# Patient Record
Sex: Male | Born: 1975 | Race: White | Hispanic: No | Marital: Married | Smoking: Never smoker
Health system: Southern US, Community
[De-identification: ages and names within clinical notes are randomized; demographics above are authoritative.]

## PROBLEM LIST (undated history)

## (undated) DIAGNOSIS — R002 Palpitations: Secondary | ICD-10-CM

## (undated) DIAGNOSIS — R0600 Dyspnea, unspecified: Secondary | ICD-10-CM

## (undated) HISTORY — DX: Morbid (severe) obesity due to excess calories: E66.01

## (undated) HISTORY — DX: Palpitations: R00.2

## (undated) HISTORY — DX: Dyspnea, unspecified: R06.00

---

## 2012-01-13 ENCOUNTER — Ambulatory Visit (INDEPENDENT_AMBULATORY_CARE_PROVIDER_SITE_OTHER): Payer: BC Managed Care – PPO | Admitting: Physician Assistant

## 2012-01-13 VITALS — BP 157/75 | HR 83 | Temp 98.0°F | Resp 17 | Ht 73.5 in | Wt 269.0 lb

## 2012-01-13 DIAGNOSIS — R21 Rash and other nonspecific skin eruption: Secondary | ICD-10-CM

## 2012-01-13 LAB — POCT SKIN KOH: Skin KOH, POC: NEGATIVE

## 2012-01-13 MED ORDER — AMOXICILLIN 875 MG PO TABS: 875.0000 mg | ORAL_TABLET | Freq: Two times a day (BID) | ORAL | Status: DC

## 2012-01-13 MED ORDER — PREDNISONE 20 MG PO TABS: ORAL_TABLET | ORAL | Status: DC

## 2012-01-13 MED ORDER — DESONIDE 0.05 % EX CREA: TOPICAL_CREAM | Freq: Two times a day (BID) | CUTANEOUS | Status: DC

## 2012-01-13 NOTE — Addendum Note (Signed)
Addended by: Nelva Nay on: 01/13/2012 07:29 PM   Modules accepted: Orders

## 2012-01-13 NOTE — Progress Notes (Signed)
  Subjective:    Patient ID: Kenneth Lopez, male    DOB: Dec 15, 1975, 36 y.o.   MRN: [id number]  HPI 36 year old male presents with over 1 month history of rash on bilateral eyelids. States it is pruritic and irritated. He has tried several OTC remedies including antibacterial cream, anti-fungal cream, hydrocortisone cream, and aquaphor. He used each as solo therapy and did not combine any of these treatments.  States the anti-fungal cream has made his eyelids more irritated and inflamed. Hydrocortisone cream seemed to improve the redness but still left the skin cracked and burning.  No new lotions, products, foods, or medications.      Review of Systems  Constitutional: Negative for fever and chills.  Musculoskeletal: Negative for arthralgias.  Skin: Positive for color change and rash.  All other systems reviewed and are negative.       Objective:   Physical Exam  Vitals reviewed. Constitutional: He is oriented to person, place, and time. He appears well-developed and well-nourished.  HENT:  Head: Normocephalic and atraumatic.  Right Ear: External ear normal.  Left Ear: External ear normal.  Eyes: Conjunctivae normal and EOM are normal. Pupils are equal, round, and reactive to light.       Bilateral erythema on eyelids with scaling and inflammation  Neck: Normal range of motion.  Cardiovascular: Normal rate.   Pulmonary/Chest: Effort normal.  Neurological: He is alert and oriented to person, place, and time.  Psychiatric: He has a normal mood and affect. His behavior is normal. Judgment and thought content normal.    Results for orders placed in visit on 01/13/12  POCT SKIN KOH      Component Value Range   Skin KOH, POC Negative           Assessment & Plan:   1. Rash and other nonspecific skin eruption  POCT Skin KOH, desonide (DESOWEN) 0.05 % cream, predniSONE (DELTASONE) 20 MG tablet, amoxicillin (AMOXIL) 875 MG tablet   Likely irritated atopic dermatitis. D/C  all other products except moisturizers and aquaphor Desonide bid x 4 days, then daily x 4 days Prednisone dose pack Follow up here or with dermatology if symptoms persist.

## 2012-01-24 ENCOUNTER — Encounter: Payer: Self-pay | Admitting: Family Medicine

## 2012-01-24 ENCOUNTER — Ambulatory Visit (INDEPENDENT_AMBULATORY_CARE_PROVIDER_SITE_OTHER): Payer: BC Managed Care – PPO | Admitting: Family Medicine

## 2012-01-24 ENCOUNTER — Ambulatory Visit (HOSPITAL_COMMUNITY)
Admission: RE | Admit: 2012-01-24 | Discharge: 2012-01-24 | Disposition: A | Discharge status: Home / Self Care | Payer: BC Managed Care – PPO | Source: Ambulatory Visit | Attending: Family Medicine | Admitting: Family Medicine

## 2012-01-24 VITALS — BP 144/93 | HR 87 | Temp 98.5°F | Resp 16 | Ht 74.0 in | Wt 271.4 lb

## 2012-01-24 DIAGNOSIS — K089 Disorder of teeth and supporting structures, unspecified: Secondary | ICD-10-CM | POA: Insufficient documentation

## 2012-01-24 DIAGNOSIS — K0889 Other specified disorders of teeth and supporting structures: Secondary | ICD-10-CM

## 2012-01-24 DIAGNOSIS — J029 Acute pharyngitis, unspecified: Secondary | ICD-10-CM

## 2012-01-24 DIAGNOSIS — M8540 Solitary bone cyst, unspecified site: Secondary | ICD-10-CM

## 2012-01-24 LAB — POCT CBC
Granulocyte percent: 59.2 %G (ref 37–80)
HCT, POC: 52.8 % (ref 43.5–53.7)
Hemoglobin: 16.2 g/dL (ref 14.1–18.1)
Lymph, poc: 3.9 — AB (ref 0.6–3.4)
MCH, POC: 28.8 pg (ref 27–31.2)
MCHC: 30.7 g/dL — AB (ref 31.8–35.4)
MCV: 93.7 fL (ref 80–97)
MID (cbc): 0.7 (ref 0–0.9)
MPV: 8.7 fL (ref 0–99.8)
POC Granulocyte: 6.7 (ref 2–6.9)
POC LYMPH PERCENT: 34.5 %L (ref 10–50)
POC MID %: 6.3 %M (ref 0–12)
Platelet Count, POC: 282 10*3/uL (ref 142–424)
RBC: 5.63 M/uL (ref 4.69–6.13)
RDW, POC: 12.9 %
WBC: 11.3 10*3/uL — AB (ref 4.6–10.2)

## 2012-01-24 LAB — POCT SEDIMENTATION RATE: POCT SED RATE: 11 mm/hr (ref 0–22)

## 2012-01-24 MED ORDER — HYDROCODONE-ACETAMINOPHEN 5-500 MG PO TABS: 1.0000 | ORAL_TABLET | Freq: Three times a day (TID) | ORAL | Status: DC | PRN

## 2012-01-24 NOTE — Patient Instructions (Addendum)
[facility] for the [facility] xray of your jaw. We will call you with the report.     Driving directions to [facility]  [phone]  - more info    [address]  [place], [place] [address]     1. Head north on [address] toward [address]      344 ft    2. Turn right onto [address]      0.3 mi    3. Slight left to stay on [address]      1.7 mi    4. Turn left onto [address]  Destination will be on the right     0.6 mi     [facility]  [address] [place]

## 2012-01-24 NOTE — Progress Notes (Signed)
36 yo with pain left jaw and gums both upper and lower for 2 days, worsening.  He has been taking amoxicillin for 2 weeks because of a periorbital inflammation which has resolve.  Objective:  Mildly swollen gum at area of tooth #17. Mild left anterior cervical adenopathy TM's normal  Assessment:  Inflamed gum with good dentition otherwise and corresponding adenopathy.  Plan:  [facility] *RADIOLOGY REPORT*  Clinical Data: Left jaw pain and swelling. No known injury.  ORTHOPANTOGRAM/PANORAMIC  Comparison: None.  Findings: There is a well-circumscribed 2.5 x 1.5 cm lucent lesion  involving the left mandibular body adjacent to the second molar.  This demonstrates no definite matrix or expansion. The wisdom  teeth have been removed. There are no peri apical lucencies. No  other lucent lesions are identified.  IMPRESSION:  Well-circumscribed 2.5 cm lytic lesion involving the left mandible  as described. This has a nonaggressive appearance and could  reflect an odontogenic/dentigerous cyst, other cyst or fibrous  dysplasia. This could be further evaluated with maxillofacial CT.  Original Report Authenticated By: Carey Bullocks, M.D.  1. Pain, dental  DG Orthopantogram, HYDROcodone-acetaminophen (VICODIN) 5-500 MG per tablet, DG Orthopantogram  2. Sore throat  DG Orthopantogram, POCT SEDIMENTATION RATE, POCT CBC  3. Bone cyst, solitary  Ambulatory referral to ENT

## 2012-01-25 ENCOUNTER — Other Ambulatory Visit: Payer: Self-pay | Admitting: Radiology

## 2012-01-25 DIAGNOSIS — M270 Developmental disorders of jaws: Secondary | ICD-10-CM

## 2012-02-08 ENCOUNTER — Other Ambulatory Visit: Payer: Self-pay | Admitting: Oral Surgery

## 2012-02-08 DIAGNOSIS — K048 Radicular cyst: Secondary | ICD-10-CM

## 2012-02-10 ENCOUNTER — Ambulatory Visit
Admission: RE | Admit: 2012-02-10 | Discharge: 2012-02-10 | Disposition: A | Discharge status: Home / Self Care | Payer: BC Managed Care – PPO | Source: Ambulatory Visit | Attending: Oral Surgery | Admitting: Oral Surgery

## 2012-02-10 DIAGNOSIS — K048 Radicular cyst: Secondary | ICD-10-CM

## 2013-04-22 IMAGING — CT CT MAXILLOFACIAL W/O CM
2 of 5 series · 14 of 37 positions shown, 18 images · non-contrast
Comparison: [facility] 01/24/2012.

CLINICAL DATA: Mandibular cyst.  Left facial pain and swelling.

CT MAXILLOFACIAL WITHOUT CONTRAST
TECHNIQUE: Multidetector CT imaging of the maxillofacial
structures was performed. Multiplanar CT image reconstructions were
also generated.

[Series 3: max bone · axial · 0.33mm/px · z∈[-130,-32]mm · 13 of 47 slices shown, 17 images]
[im 4/47  brain]
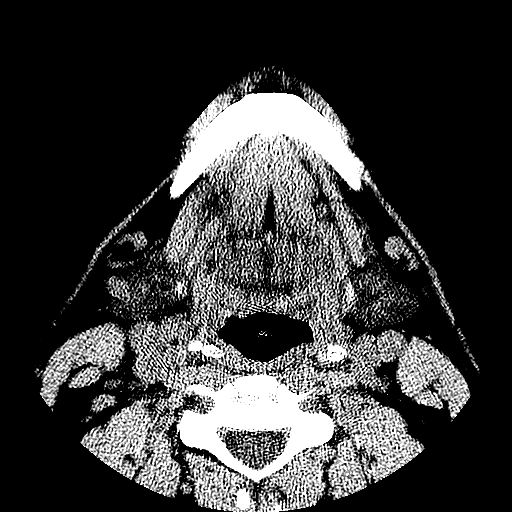
[im 4/47  bone]
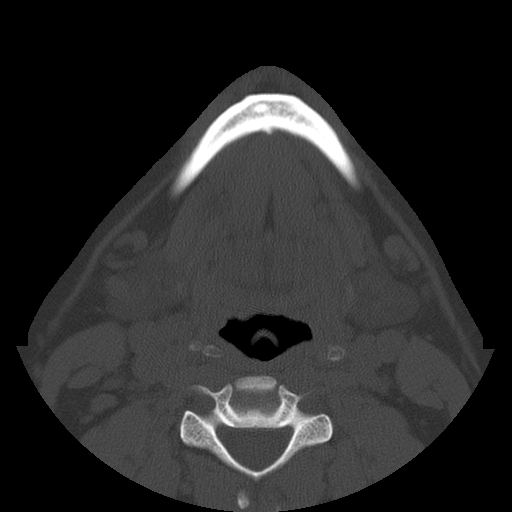
[im 7/47  bone]
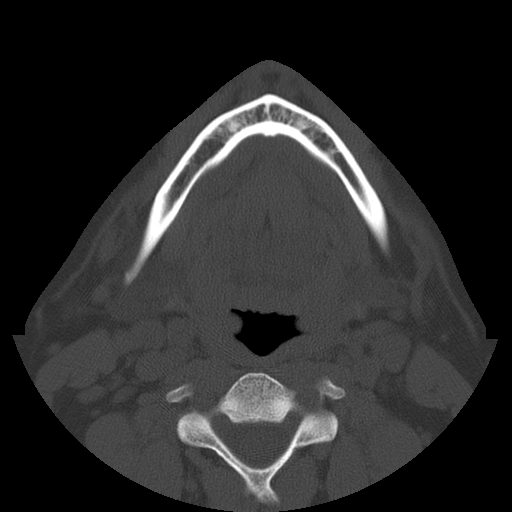
[im 10/47  bone]
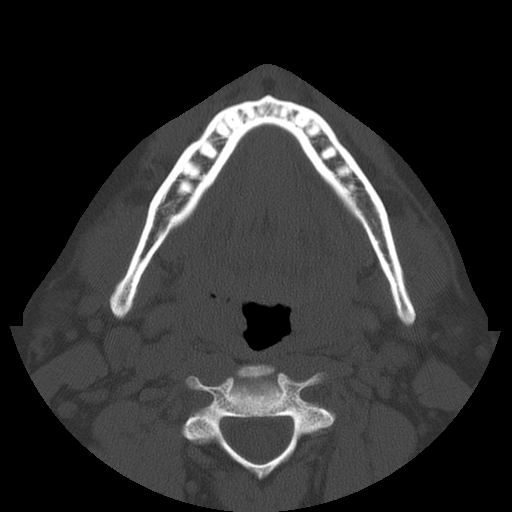
[im 14/47  bone]
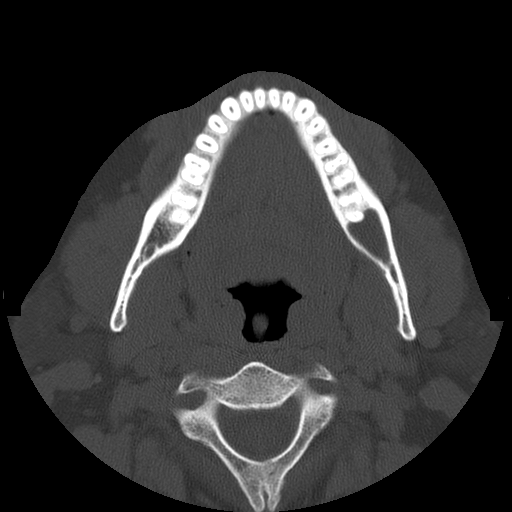
[im 17/47  brain]
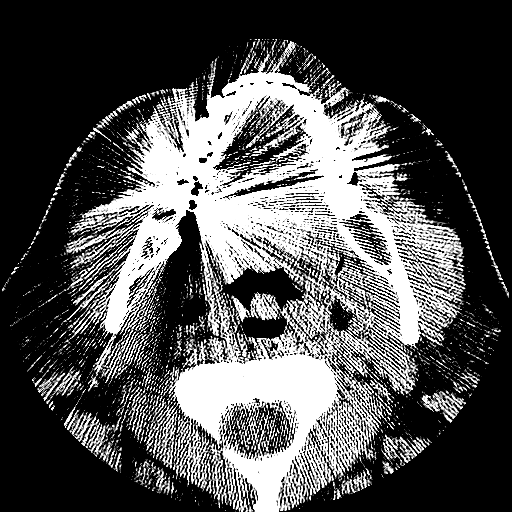
[im 17/47  bone]
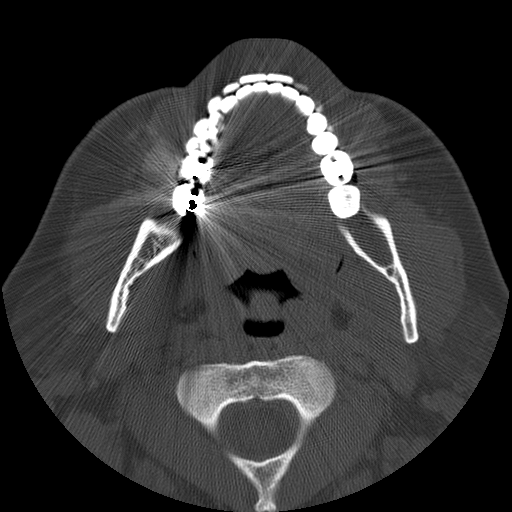
[im 20/47  bone]
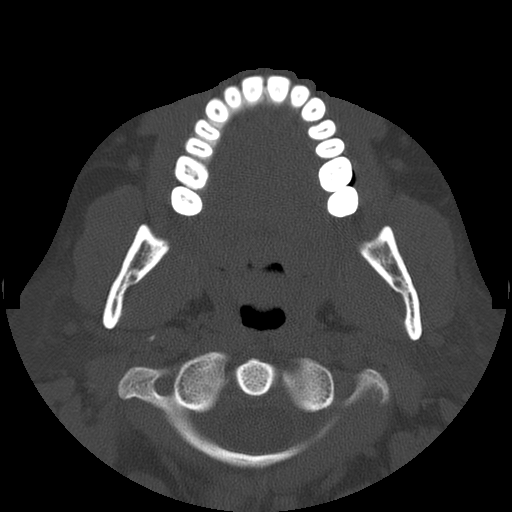
[im 24/47  bone]
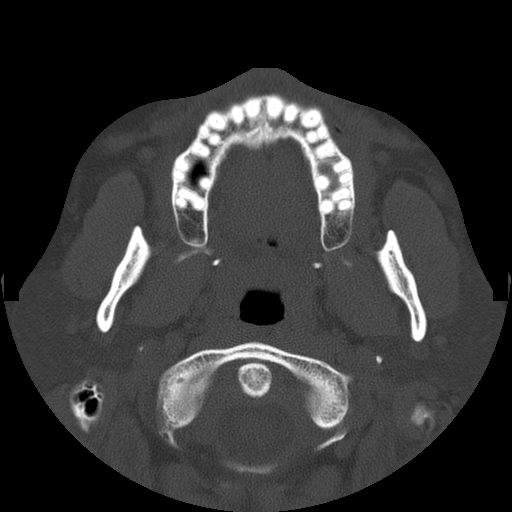
[im 27/47  bone]
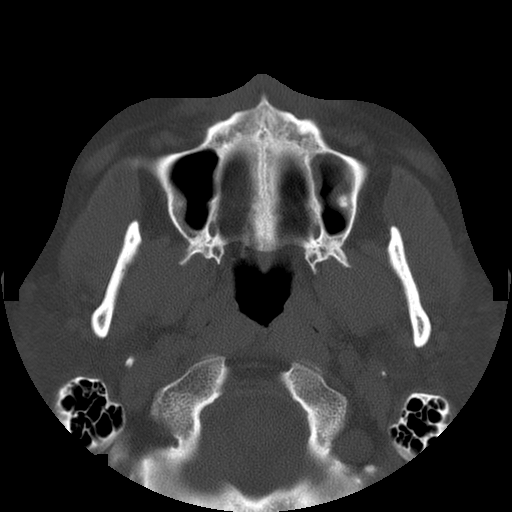
[im 30/47  brain]
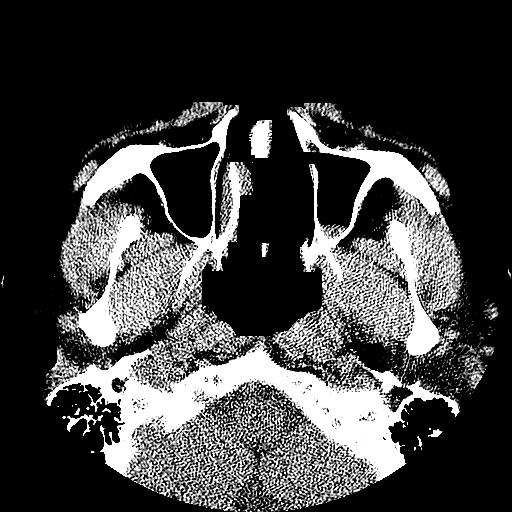
[im 30/47  bone]
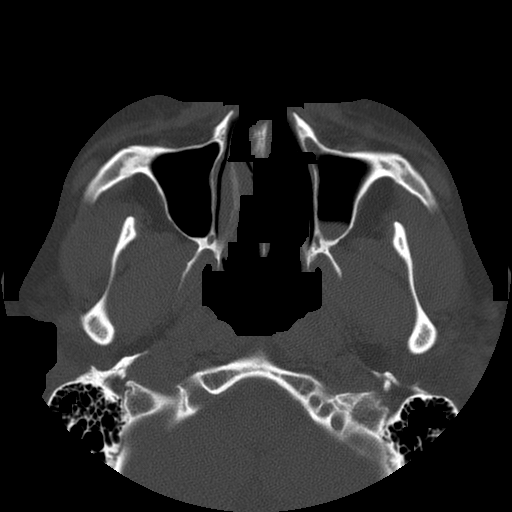
[im 33/47  bone]
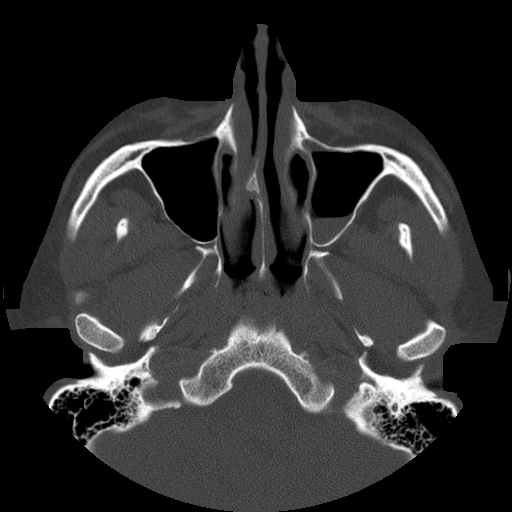
[im 37/47  bone]
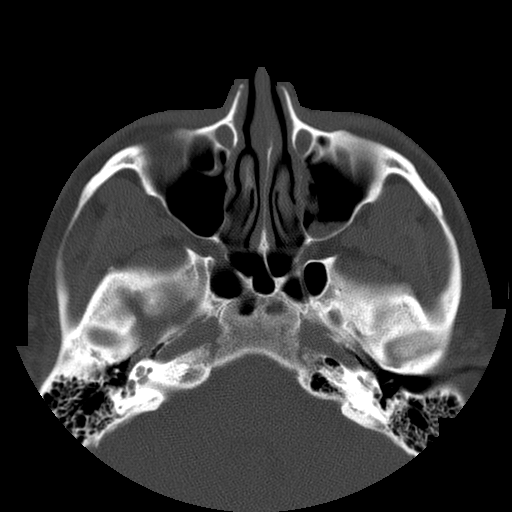
[im 40/47  bone]
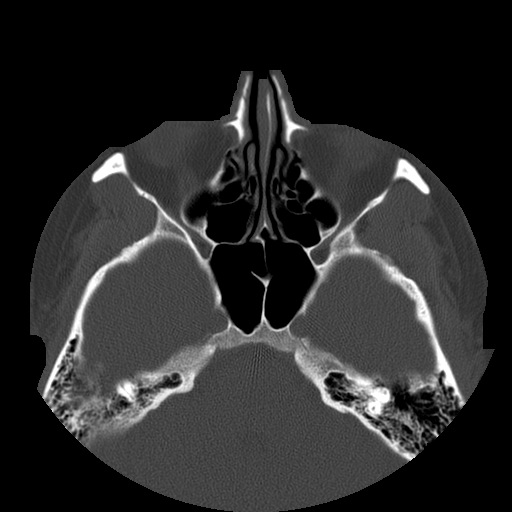
[im 43/47  brain]
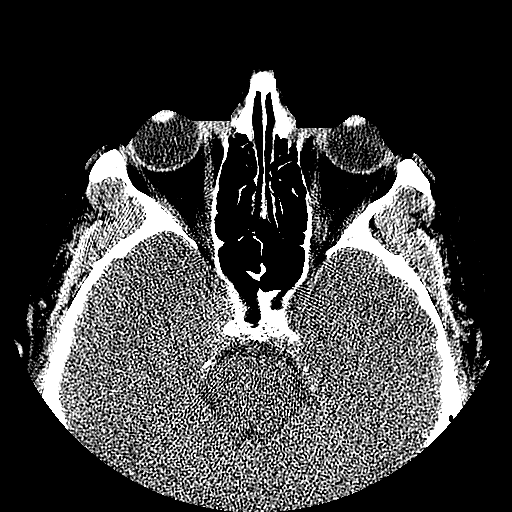
[im 43/47  bone]
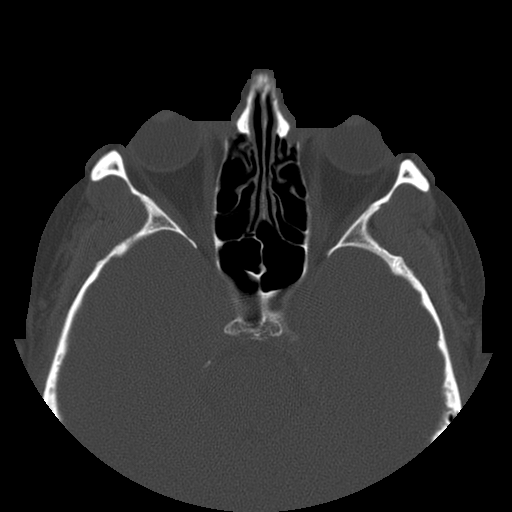

[Series 400: sag · sagittal · 0.33mm/px · 1 of 81 slices shown]
[im 41/81  bone]
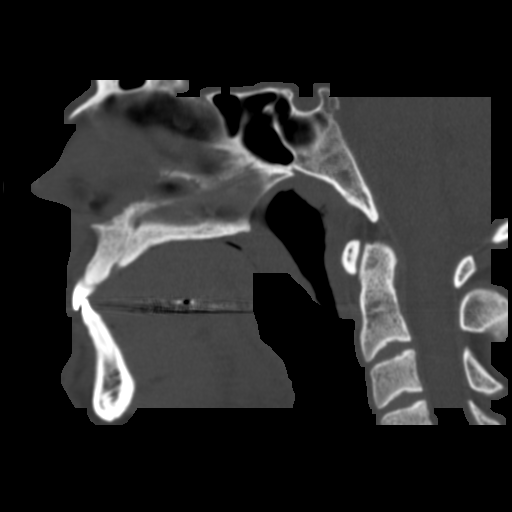

[14 of 37 positions shown; findings below may reference images not displayed]

FINDINGS: A well-defined smoothly marginated cystic lesion is
present within the posterior left mandible, just posterior to the
left second molar.  The posterior root of the left second molars
exposed to this cyst.  There is no significant expansion or
destruction of the bone.  No clear cortical covering is evident
over the superior aspect of the lesion.

The teeth are otherwise unremarkable.  No other significant.  Onto
lesions are present.  A fluid level is present in the left
maxillary sinus.  Minimal mucosal thickening is present in the left
maxillary sinus more anteriorly.  The visualized paranasal sinuses
and mastoid air cells are otherwise clear.
IMPRESSION: 1.  Cystic lesion posterior to the left second molar is most
compatible with an odontogenic  keratocyst.  A radicular cyst is
considered less likely.

## 2014-08-28 ENCOUNTER — Encounter (HOSPITAL_COMMUNITY)
Admission: AD | Disposition: A | Discharge status: Home / Self Care | Payer: Self-pay | Source: Ambulatory Visit | Attending: Cardiology

## 2014-08-28 ENCOUNTER — Ambulatory Visit (HOSPITAL_COMMUNITY)
Admission: AD | Admit: 2014-08-28 | Discharge: 2014-08-28 | Disposition: A | Discharge status: Home / Self Care | Payer: Managed Care, Other (non HMO) | Source: Ambulatory Visit | Attending: Cardiology | Admitting: Cardiology

## 2014-08-28 DIAGNOSIS — Z6836 Body mass index (BMI) 36.0-36.9, adult: Secondary | ICD-10-CM | POA: Diagnosis not present

## 2014-08-28 DIAGNOSIS — Z87891 Personal history of nicotine dependence: Secondary | ICD-10-CM | POA: Insufficient documentation

## 2014-08-28 DIAGNOSIS — E669 Obesity, unspecified: Secondary | ICD-10-CM | POA: Diagnosis not present

## 2014-08-28 DIAGNOSIS — Z8249 Family history of ischemic heart disease and other diseases of the circulatory system: Secondary | ICD-10-CM | POA: Insufficient documentation

## 2014-08-28 DIAGNOSIS — I209 Angina pectoris, unspecified: Secondary | ICD-10-CM | POA: Diagnosis present

## 2014-08-28 DIAGNOSIS — I200 Unstable angina: Secondary | ICD-10-CM

## 2014-08-28 DIAGNOSIS — I25119 Atherosclerotic heart disease of native coronary artery with unspecified angina pectoris: Secondary | ICD-10-CM | POA: Insufficient documentation

## 2014-08-28 DIAGNOSIS — E785 Hyperlipidemia, unspecified: Secondary | ICD-10-CM | POA: Insufficient documentation

## 2014-08-28 HISTORY — PX: CARDIAC CATHETERIZATION: SHX172

## 2014-08-28 SURGERY — LEFT HEART CATH AND CORONARY ANGIOGRAPHY
Anesthesia: LOCAL

## 2014-08-28 MED ORDER — NITROGLYCERIN 1 MG/10 ML FOR IR/CATH LAB
INTRA_ARTERIAL | Status: DC | PRN
  Administered 2014-08-28: 17:00:00

## 2014-08-28 MED ORDER — SODIUM CHLORIDE 0.9 % WEIGHT BASED INFUSION: 3.0000 mL/kg/h | INTRAVENOUS | Status: DC

## 2014-08-28 MED ORDER — MIDAZOLAM HCL 2 MG/2ML IJ SOLN
INTRAMUSCULAR | Status: DC | PRN
  Administered 2014-08-28: 1 mg via INTRAVENOUS
  Administered 2014-08-28: 2 mg via INTRAVENOUS

## 2014-08-28 MED ORDER — SODIUM CHLORIDE 0.9 % IJ SOLN: 3.0000 mL | INTRAMUSCULAR | Status: DC | PRN

## 2014-08-28 MED ORDER — SODIUM CHLORIDE 0.9 % IV SOLN: 250.0000 mL | INTRAVENOUS | Status: DC | PRN

## 2014-08-28 MED ORDER — ASPIRIN 81 MG PO CHEW
81.0000 mg | CHEWABLE_TABLET | ORAL | Status: AC
  Administered 2014-08-28: 81 mg via ORAL

## 2014-08-28 MED ORDER — FENTANYL CITRATE (PF) 100 MCG/2ML IJ SOLN: 25.0000 ug | Freq: Once | INTRAMUSCULAR | Status: DC

## 2014-08-28 MED ORDER — IOHEXOL 350 MG/ML SOLN
INTRAVENOUS | Status: DC | PRN
  Administered 2014-08-28: 95 mL via INTRAVENOUS

## 2014-08-28 MED ORDER — LIDOCAINE HCL (PF) 1 % IJ SOLN
INTRAMUSCULAR | Status: AC
  Filled 2014-08-28: qty 30

## 2014-08-28 MED ORDER — MIDAZOLAM HCL 2 MG/2ML IJ SOLN
INTRAMUSCULAR | Status: AC
  Filled 2014-08-28: qty 2

## 2014-08-28 MED ORDER — SODIUM CHLORIDE 0.9 % IJ SOLN: 3.0000 mL | Freq: Two times a day (BID) | INTRAMUSCULAR | Status: DC

## 2014-08-28 MED ORDER — SODIUM CHLORIDE 0.9 % WEIGHT BASED INFUSION
3.0000 mL/kg/h | INTRAVENOUS | Status: AC
  Administered 2014-08-28: 3 mL/kg/h via INTRAVENOUS

## 2014-08-28 MED ORDER — ASPIRIN 81 MG PO CHEW
CHEWABLE_TABLET | ORAL | Status: AC
  Filled 2014-08-28: qty 1

## 2014-08-28 MED ORDER — RADIAL COCKTAIL (HEPARIN/VERAPAMIL/LIDOCAINE/NITRO)
Status: DC | PRN
  Administered 2014-08-28: 1 via INTRA_ARTERIAL

## 2014-08-28 MED ORDER — HEPARIN SODIUM (PORCINE) 1000 UNIT/ML IJ SOLN
INTRAMUSCULAR | Status: DC | PRN
  Administered 2014-08-28: 7500 [IU] via INTRAVENOUS

## 2014-08-28 MED ORDER — SODIUM CHLORIDE 0.9 % WEIGHT BASED INFUSION: 1.0000 mL/kg/h | INTRAVENOUS | Status: DC

## 2014-08-28 MED ORDER — HYDROMORPHONE HCL 1 MG/ML IJ SOLN
INTRAMUSCULAR | Status: DC | PRN
  Administered 2014-08-28 (×2): 0.5 mg via INTRAVENOUS

## 2014-08-28 MED ORDER — SODIUM CHLORIDE 0.9 % IJ SOLN
3.0000 mL | INTRAMUSCULAR | Status: DC | PRN
Start: 2014-08-28 — End: 2014-08-29

## 2014-08-28 MED ORDER — HYDROMORPHONE HCL 1 MG/ML IJ SOLN
INTRAMUSCULAR | Status: AC
  Filled 2014-08-28: qty 1

## 2014-08-28 MED ORDER — HEPARIN (PORCINE) IN NACL 2-0.9 UNIT/ML-% IJ SOLN
INTRAMUSCULAR | Status: AC
  Filled 2014-08-28: qty 1000

## 2014-08-28 MED ORDER — NITROGLYCERIN 1 MG/10 ML FOR IR/CATH LAB
INTRA_ARTERIAL | Status: AC
  Filled 2014-08-28: qty 20

## 2014-08-28 MED ORDER — NITROGLYCERIN 1 MG/10 ML FOR IR/CATH LAB
INTRA_ARTERIAL | Status: DC | PRN
  Administered 2014-08-28 (×2): 200 ug

## 2014-08-28 SURGICAL SUPPLY — 11 items
CATH INFINITI 5FR MPB2 (CATHETERS) IMPLANT
CATH OPTITORQUE TIG 4.0 5F (CATHETERS) ×2 IMPLANT
DEVICE RAD COMP TR BAND LRG (VASCULAR PRODUCTS) ×2 IMPLANT
GLIDESHEATH SLEND A-KIT 6F 20G (SHEATH) ×2 IMPLANT
KIT HEART LEFT (KITS) ×2 IMPLANT
PACK CARDIAC CATHETERIZATION (CUSTOM PROCEDURE TRAY) ×2 IMPLANT
SHEATH PINNACLE 5F 10CM (SHEATH) IMPLANT
TRANSDUCER W/STOPCOCK (MISCELLANEOUS) ×2 IMPLANT
TUBING CIL FLEX 10 FLL-RA (TUBING) ×2 IMPLANT
WIRE EMERALD 3MM-J .035X150CM (WIRE) IMPLANT
WIRE SAFE-T 1.5MM-J .035X260CM (WIRE) ×2 IMPLANT

## 2014-08-28 NOTE — Discharge Instructions (Signed)
Radial Site Care °Refer to this sheet in the next few weeks. These instructions provide you with information on caring for yourself after your procedure. Your caregiver may also give you more specific instructions. Your treatment has been planned according to current medical practices, but problems sometimes occur. Call your caregiver if you have any problems or questions after your procedure. °HOME CARE INSTRUCTIONS °· You may shower the day after the procedure. Remove the bandage (dressing) and gently wash the site with plain soap and water. Gently pat the site dry. °· Do not apply powder or lotion to the site. °· Do not submerge the affected site in water for 3 to 5 days. °· Inspect the site at least twice daily. °· Do not flex or bend the affected arm for 24 hours. °· No lifting over 5 pounds (2.3 kg) for 5 days after your procedure. °· Do not drive home if you are discharged the same day of the procedure. Have someone else drive you. °· You may drive 24 hours after the procedure unless otherwise instructed by your caregiver. °· Do not operate machinery or power tools for 24 hours. °· A responsible adult should be with you for the first 24 hours after you arrive home. °What to expect: °· Any bruising will usually fade within 1 to 2 weeks. °· Blood that collects in the tissue (hematoma) may be painful to the touch. It should usually decrease in size and tenderness within 1 to 2 weeks. °SEEK IMMEDIATE MEDICAL CARE IF: °· You have unusual pain at the radial site. °· You have redness, warmth, swelling, or pain at the radial site. °· You have drainage (other than a small amount of blood on the dressing). °· You have chills. °· You have a fever or persistent symptoms for more than 72 hours. °· You have a fever and your symptoms suddenly get worse. °· Your arm becomes pale, cool, tingly, or numb. °· You have heavy bleeding from the site. Hold pressure on the site. °Document Released: 03/12/2010 Document Revised:  05/02/2011 Document Reviewed: 03/12/2010 °ExitCare® Patient Information ©2015 [employer]. This information is not intended to replace advice given to you by your health care provider. Make sure you discuss any questions you have with your health care provider. ° °

## 2014-08-28 NOTE — H&P (Signed)
OFFICE VISIT NOTES COPIED TO EPIC FOR DOCUMENTATION  Kenneth Lopez 08/27/2014 12:57 PM Location: [facility] Patient #: [id number] DOB: 1975/08/30 Married / Language: Kenneth Lopez / Race: White Male  History of Present Illness Kenneth Lopez Lopez; 08/27/2014 2:02 PM) The patient is a 39 year old male who presents with chest pain. Symptoms include chest pain. The pain is located in the left anterior chest. The pain radiates to the left arm and left shoulder. The patient describes the pain as burning and tight. Onset was sudden 2 week(s) ago. Onset followed exertion. The symptoms occur with activity. The patient describes this as moderate in severity and unchanged. Symptoms are exacerbated by exertion. Symptoms are relieved by resting. Associated symptoms include dyspnea, fatigue and heartburn, while associated symptoms do not include leg swelling, lightheadedness or syncope. Current treatment includes aspirin and proton pump inhibitors. By report there is good compliance with treatment, good tolerance of treatment and fair symptom control. Risk factors include atherogenic diet, cigarette smoking (quit in 2012), obesity, sedentary lifestyle and family history of premature coronary artery disease. The patient was previously evaluated by a primary physician 2 week(s) ago. Previous presentation included chest pain. Past evaluation has included stress ECG testing. Past treatment has included aspirin and proton pump inhibitors.  Problem List/Past [facility] Kenneth Lopez, [place]; 08/27/2014 1:43 PM) No Known Problems07/07/2014 (Marked as Inactive)  Allergies (Kenneth Lopez; 08/27/2014 1:01 PM) [employer]! [employer] *DERMATOLOGICALS* Dermatitis, Hives, Swelling.  Family History Kenneth Lopez; 08/27/2014 1:04 PM) Mother In stable health. DM; no heart attacks or strokes; HTN, but no other cardiovascular conditions Father In stable health. MI at age 69, no heart attacks since then, no  strokes; cardiac bypass at age 79, no stents, CAD but no other cardiovascular conditions Sister 1 In good health. 2 yrs older; no cardiovascular conditions Brother 1 In good health. 6 yrs younger; no cardiovascular conditions  Social History Kenneth Lopez; 08/27/2014 1:06 PM) Marital status Married. Living Situation Lives with spouse. Number of Children 3. Alcohol Use Occasional alcohol use. Current tobacco use Former smoker. quit in 2012  Past Surgical History (Kenneth Lopez; 08/27/2014 1:07 PM) Elbow Fracture [id number] Mandibular Cyst Removal2014  Medication History (Kenneth Lopez; 08/27/2014 1:09 PM) Omeprazole ($RemoveBeforeD'40MG'[id number]$  Capsule DR, 1 Oral daily) Active. Aspirin ($RemoveBeforeDE'81MG'fjaqpojggJBbbqw$  Tablet DR, 1 Oral daily) Active. Medications Reconciled  Diagnostic Studies History Kenneth Lopez; 08/27/2014 1:08 PM) Treadmill stress test07/07/2014   Review of Systems (Kenneth Lopez; 08/27/2014 2:00 PM) General Present- Fatigue. Not Present- Anorexia and Fever. Respiratory Present- Decreased Exercise Tolerance. Not Present- Cough and Dyspnea. Cardiovascular Present- Chest Pain. Not Present- Claudications, Edema, Orthopnea, Paroxysmal Nocturnal Dyspnea and Shortness of Breath. Gastrointestinal Not Present- Change in Bowel Habits, Constipation and Nausea. Neurological Not Present- Focal Neurological Symptoms. Endocrine Not Present- Appetite Changes, Cold Intolerance and Heat Intolerance. Hematology Not Present- Anemia, Petechiae and Prolonged Bleeding. Vitals Kenneth Lopez; 08/27/2014 1:12 PM) 08/27/2014 1:09 PM Weight: 291 lb Height: 75in Body Surface Area: 2.57 m Body Mass Index: 36.37 kg/m  Pulse: 116 (Regular)  P.OX: 97% (Room air) BP: 138/92 (Sitting, Left Arm, Standard)     Physical Exam (Kenneth Lopez; 08/27/2014 1:58 PM) General Mental Status-Alert. General Appearance-Cooperative, Appears stated age, Not in acute distress. Orientation-Oriented  X3. Build & Nutrition-Well built and Moderately obese.  Head and Neck Thyroid Gland Characteristics - no palpable nodules, no palpable enlargement.  Chest and Lung Exam Palpation Tender - No chest wall tenderness. Auscultation Breath sounds - Clear.  Cardiovascular Inspection Jugular vein - Right - No  Distention. Auscultation Heart Sounds - S1 WNL, S2 WNL and No gallop present. Murmurs & Other Heart Sounds - Murmur - No murmur.  Abdomen Palpation/Percussion Palpation and Percussion of the abdomen reveal - Non Tender and No hepatosplenomegaly. Auscultation Auscultation of the abdomen reveals - Bowel sounds normal.  Peripheral Vascular Lower Extremity Inspection - Left - No Pigmentation, No Varicose veins. Right - No Pigmentation, No Varicose veins. Palpation - Edema - Left - No edema. Right - No edema. Femoral pulse - Left - Normal. Right - Normal. Popliteal pulse - Left - Normal. Right - Normal. Dorsalis pedis pulse - Left - Normal. Right - Normal. Posterior tibial pulse - Left - Normal. Right - Normal. Carotid arteries - Left-No Carotid bruit. Carotid arteries - Right-No Carotid bruit. Abdomen-No prominent abdominal aortic pulsation, No epigastric bruit.  Neurologic Motor-Grossly intact without any focal deficits.  Musculoskeletal Global Assessment Left Lower Extremity - normal range of motion without pain. Right Lower Extremity - normal range of motion without pain.  Assessment & Plan (Kenneth Lopez; 08/27/2014 2:31 PM) New-onset angina (I20.9) Impression: EKG 08/27/2014: Sinus tachycardia at rate of 101 bpm, normal axis, T wave inversion in lead 3 and aVF, cannot exclude ischemia. Current Plans Complete electrocardiogram (93000) Started Crestor 20MG , 1 (one) Tablet daily, #30, 08/27/2014, Ref. x2. Started Metoprolol Tartrate 25MG , 1 (one) Tablet three times daily, #90, 08/27/2014, Ref. x1. Started Nitrostat 0.4MG , 1 (one) Tab Sublingual every 5  minutes as needed for chest pain., #25, 08/27/2014, No Refill. Family history of premature CAD (Z32.49) Story: Father had MI and CABG at age 30, paternal grandfather had MI in early 33s. Abnormal stress test (R94.39) Story: Treadmill Exercise stress 08/27/2014: Indication: Chest pain and dyspnea. The patient exercised according to Bruce Protocol, Total time recorded 9:00 min achieving max heart rate of 176 which was 96% of MPHR for age and 10.16 METS of work. Normal BP response. Resting EKG NSR, Non specific T inversion in 2, aVF. With stress testing there was 2 down sloping mm ST depression noted at peak exercise that persisted for > 4 minutes into recovery and suggestive of ischemia in the inferior and lateral leads. Stress terminated due to dyspnea and mild chest discomfort. He achieved THR ( 96% MPHR)/MPHR met. Recommend: Consider further cardiac work up. Clinical correlation recommended. Obesity (BMI 30-39.9) (E66.9) History of tobacco use - Status is Resolved (W11.914) Story: 10 pack year history, quit in 2012 Current Plans Mechanism of underlying disease process and action of medications discussed with the patient. I discussed primary/secondary prevention and also dietary counceling was done. Patient was sent for treadmill stress test due to exertional chest pain. Symptoms began 2-1/2 weeks ago when he was hiking in the mail. He states he would develop chest discomfort with radiation to the left shoulder and arm every time he would climb up hill that would improve with rest. Later that day he developed persistent burning chest pain that he attributed to indigestion. He saw his PCP the following week and stress test was ordered. On review of stress test, EKG revealed 68mm down sloping ST depression noted at peak exercise that persisted for > 4 minutes into recovery, suggestive of ischemia in the inferior and lateral leads. He was added on for an office visit for further evaluation. Risk factors  include obesity and family history of premature CAD. Patient recently established with the PCP and has not had any recent lab work. Will check lipids, TSH, CMP, and CBC today. He is currently taking 43  mg aspirin. He was advised to begin Crestor and metoprolol for cardioprotection. Patient instructed not to do heavy lifting, heavy exertional activity, swimming until evaluation is complete. Patient instructed to call if symptoms worse or to go to the ED for further evaluation. S/L NTG was prescribed and explained how to and when to use it and to notify us if there is change in frequency of use. Interaction with cialis-like agents was discussed.  Due to symptoms concerning for angina, signficant risk factors for CAD, and abnormal stress EKG, we'll schedule for cardiac catheterization, and possible angioplasty. We discussed regarding risks, benefits, alternatives to this including nuclear stress testing, CTA, and continued medical therapy. Patient wants to proceed. Understands <1-2% risk of death, stroke, MI, urgent CABG, bleeding, infection, renal failure but not limited to these. Video recording of the procedure shown to the patient. Follow up after coronary angiogram.  *I have discussed this case with Dr. Einar Gip and he personally examined the patient and participated in formulating the plan.*    Signed by Kenneth Lopez, Lopez (08/27/2014 2:32 PM)  I have personally reviewed the patient's record and performed physical exam and agree with the assessment and plan of Ms. Kenneth Labella, NP-C. High risk GXT with abnormal EKG and suspect he had ACS at onset. Hence proceed with coronary angiogram.  [name], MD 08/28/2014, 11:28 AM [facility]. [place] Pager: [phone] Office: [phone] If no answer: Cell:  [phone]

## 2014-08-28 NOTE — Interval H&P Note (Signed)
History and Physical Interval Note:  08/28/2014 4:26 PM  Kenneth Lopez  has presented today for surgery, with the diagnosis of aqbnormal stress test  The various methods of treatment have been discussed with the patient and family. After consideration of risks, benefits and other options for treatment, the patient has consented to  Procedure(s): Left Heart Cath and Coronary Angiography (N/A) and possible PCI  as a surgical intervention .  The patient's history has been reviewed, patient examined, no change in status, stable for surgery.  I have reviewed the patient's chart and labs.  Questions were answered to the patient's satisfaction.    Ischemic Symptoms? CCS III (Marked limitation of ordinary activity) Anti-ischemic Medical Therapy? Minimal Therapy (1 class of medications) Non-invasive Test Results? High-risk stress test findings: cardiac mortality >3%/yr Prior CABG? No Previous CABG   Patient Information:   1-2V CAD, no prox LAD  A (8)  Indication: 18; Score: 8   Patient Information:   CTO of 1 vessel, no other CAD  A (7)  Indication: 28; Score: 7   Patient Information:   1V CAD with prox LAD  A (9)  Indication: 34; Score: 9   Patient Information:   2V-CAD with prox LAD  A (9)  Indication: 40; Score: 9   Patient Information:   3V-CAD without LMCA  A (9)  Indication: 46; Score: 9   Patient Information:   3V-CAD without LMCA With Abnormal LV systolic function  A (9)  Indication: 48; Score: 9   Patient Information:   LMCA-CAD  A (9)  Indication: 49; Score: 9   Patient Information:   2V-CAD with prox LAD PCI  A (7)  Indication: 62; Score: 7   Patient Information:   2V-CAD with prox LAD CABG  A (8)  Indication: 62; Score: 8   Patient Information:   3V-CAD without LMCA With Low CAD burden(i.e., 3 focal stenoses, low SYNTAX score) PCI  A (7)  Indication: 63; Score: 7   Patient Information:   3V-CAD without LMCA With Low  CAD burden(i.e., 3 focal stenoses, low SYNTAX score) CABG  A (9)  Indication: 63; Score: 9   Patient Information:   3V-CAD without LMCA E06c - Intermediate-high CAD burden (i.e., multiple diffuse lesions, presence of CTO, or high SYNTAX score) PCI  U (4)  Indication: 64; Score: 4   Patient Information:   3V-CAD without LMCA E06c - Intermediate-high CAD burden (i.e., multiple diffuse lesions, presence of CTO, or high SYNTAX score) CABG  A (9)  Indication: 64; Score: 9   Patient Information:   LMCA-CAD With Isolated LMCA stenosis  PCI  U (6)  Indication: 65; Score: 6   Patient Information:   LMCA-CAD With Isolated LMCA stenosis  CABG  A (9)  Indication: 65; Score: 9   Patient Information:   LMCA-CAD Additional CAD, low CAD burden (i.e., 1- to 2-vessel additional involvement, low SYNTAX score) PCI  U (5)  Indication: 66; Score: 5   Patient Information:   LMCA-CAD Additional CAD, low CAD burden (i.e., 1- to 2-vessel additional involvement, low SYNTAX score) CABG  A (9)  Indication: 66; Score: 9   Patient Information:   LMCA-CAD Additional CAD, intermediate-high CAD burden (i.e., 3-vessel involvement, presence of CTO, or high SYNTAX score) PCI  I (3)  Indication: 67; Score: 3   Patient Information:   LMCA-CAD Additional CAD, intermediate-high CAD burden (i.e., 3-vessel involvement, presence of CTO, or high SYNTAX score) CABG  A (9)  Indication: 67; Score: 9  [name]

## 2014-08-28 NOTE — Progress Notes (Signed)
Patient had short episode of symptomatic bradycardia with paleness and sweating while sitting in recliner.  Vital signs recorded and heart rate noted to dip to 34-60 briefly for less than 30 seconds around 1900. Patient complaining of seeing spots that seemed like previous pre- migraine episode.  Dr. Jacinto HalimGanji notified.  Order received for fentanyl. Patient refused.  Patient left with wife in stable condition with no complaints of pain or otherwise.

## 2014-08-29 ENCOUNTER — Encounter (HOSPITAL_COMMUNITY): Payer: Self-pay | Admitting: Cardiology

## 2014-09-02 ENCOUNTER — Encounter (HOSPITAL_COMMUNITY): Admission: RE | Payer: Self-pay | Source: Ambulatory Visit

## 2014-09-02 ENCOUNTER — Ambulatory Visit (HOSPITAL_COMMUNITY)
Admission: RE | Admit: 2014-09-02 | Payer: Managed Care, Other (non HMO) | Source: Ambulatory Visit | Admitting: Cardiology

## 2014-09-02 SURGERY — LEFT HEART CATH AND CORONARY ANGIOGRAPHY
Anesthesia: LOCAL

## 2014-10-20 ENCOUNTER — Institutional Professional Consult (permissible substitution): Payer: Managed Care, Other (non HMO) | Admitting: Neurology

## 2014-10-20 ENCOUNTER — Telehealth: Payer: Self-pay

## 2014-10-20 NOTE — Telephone Encounter (Signed)
Patient did not show for appt today.                         

## 2014-10-22 ENCOUNTER — Encounter: Payer: Self-pay | Admitting: Neurology

## 2022-06-01 ENCOUNTER — Ambulatory Visit: Payer: Managed Care, Other (non HMO) | Admitting: Nurse Practitioner

## 2022-06-01 NOTE — Progress Notes (Deleted)
  Bethanie Dicker, NP-C Phone: [phone]  Kenneth Lopez is a 47 y.o. 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/28/2014   Resolved Ambulatory Problems    Diagnosis Date Noted   No Resolved Ambulatory Problems   Past Medical History:  Diagnosis Date   Dyspnea    Morbid obesity (HCC)    Palpitations     Family History  Problem Relation Age of Onset   Diabetes Mother    Hypertension Father     Social History   Socioeconomic History   Marital status: Married    Spouse name: Not on file   Number of children: Not on file   Years of education: Not on file   Highest education level: Not on file  Occupational History   Not on file  Tobacco Use   Smoking status: Never   Smokeless tobacco: Not on file  Substance and Sexual Activity   Alcohol use: No   Drug use: No   Sexual activity: Yes    Birth control/protection: None  Other Topics Concern   Not on file  Social History Narrative   Not on file   Social Determinants of Health   Financial Resource Strain: Not on file  Food Insecurity: Not on file  Transportation Needs: Not on file  Physical Activity: Not on file  Stress: Not on file  Social Connections: Not on file  Intimate Partner Violence: Not on file    ROS  General:  Negative for nexplained weight loss, fever Skin: Negative for new or changing mole, sore that won't heal HEENT: Negative for trouble hearing, trouble seeing, ringing in ears, mouth sores, hoarseness, change in voice, dysphagia. CV:  Negative for chest pain, dyspnea, edema, palpitations Resp: Negative for cough, dyspnea, hemoptysis GI: Negative for nausea, vomiting, diarrhea, constipation, abdominal pain, melena, hematochezia. GU: Negative for dysuria, incontinence, urinary hesitance, hematuria, vaginal or penile discharge, polyuria, sexual difficulty, lumps in testicle or breasts MSK: Negative for  muscle cramps or aches, joint pain or swelling Neuro: Negative for headaches, weakness, numbness, dizziness, passing out/fainting Psych: Negative for depression, anxiety, memory problems  Objective  Physical Exam There were no vitals filed for this visit.  BP Readings from Last 3 Encounters:  08/28/14 108/69  01/24/12 (!) 144/93  01/13/12 (!) 157/75   Wt Readings from Last 3 Encounters:  08/28/14 292 lb (132.5 kg)  01/24/12 271 lb 6.4 oz (123.1 kg)  01/13/12 269 lb (122 kg)    Physical Exam   Assessment/Plan:   There are no diagnoses linked to this encounter.  No follow-ups on file.   Bethanie Dicker, NP-C [facility] - [employer]

## 2022-06-02 ENCOUNTER — Ambulatory Visit: Payer: Managed Care, Other (non HMO) | Admitting: Physician Assistant

## 2022-11-24 ENCOUNTER — Encounter: Payer: Self-pay | Admitting: Family

## 2022-11-24 ENCOUNTER — Ambulatory Visit: Payer: Managed Care, Other (non HMO) | Admitting: Family

## 2022-11-24 VITALS — BP 138/86 | HR 81 | Temp 98.4°F | Ht 74.0 in | Wt 307.2 lb

## 2022-11-24 DIAGNOSIS — N529 Male erectile dysfunction, unspecified: Secondary | ICD-10-CM | POA: Insufficient documentation

## 2022-11-24 DIAGNOSIS — F909 Attention-deficit hyperactivity disorder, unspecified type: Secondary | ICD-10-CM

## 2022-11-24 DIAGNOSIS — R0683 Snoring: Secondary | ICD-10-CM

## 2022-11-24 DIAGNOSIS — Z0000 Encounter for general adult medical examination without abnormal findings: Secondary | ICD-10-CM | POA: Insufficient documentation

## 2022-11-24 DIAGNOSIS — R030 Elevated blood-pressure reading, without diagnosis of hypertension: Secondary | ICD-10-CM

## 2022-11-24 DIAGNOSIS — I200 Unstable angina: Secondary | ICD-10-CM | POA: Diagnosis not present

## 2022-11-24 MED ORDER — SILDENAFIL CITRATE 25 MG PO TABS: 25.0000 mg | ORAL_TABLET | Freq: Every day | ORAL | 1 refills | Status: AC | PRN

## 2022-11-24 NOTE — Patient Instructions (Addendum)
Referral to pulmonology for evaluation of sleep apnea   Let us know if you dont hear back within a week in regards to an appointment being scheduled.   So that you are aware, if you are [facility] MyChart user , please pay attention to your MyChart messages as you may receive a MyChart message with a phone number to call and schedule this test/appointment own your own from our referral coordinator. This is a new process so I do not want you to miss this message.  If you are not a MyChart user, you will receive a phone call.     we discussed CT calcium score ( SELF PAY option)  to further stratify your overall cardiovascular risk .   An estimate of cost is $150-200 out-of-pocket as not covered by insurance. However please call your insurance company in advance to ensure no other costs so that you do not have any unexpected bills.     I have placed your order to [facility] in [place] as  generally most convenient.   Phone Number to [facility] on [address] is is [phone] to get scheduled.   Please call to get scheduled and if any issues at all in doing so, please let me know.   Below an article from [facility] regarding the test.   [email or web address].   Exams We Offer: Cardiac CT Calcium Score  Knowing your score could save your life. A cardiac CT calcium score, also known as a coronary calcium scan, is a quick, convenient and noninvasive way of evaluating the amount of calcified (hard) plaque in your heart vessels. The level of calcium equates to the extent of plaque build-up in your arteries. Plaque in the arteries can cause heart attacks.  The radiologist reads the images and sends your doctor a report with a calcium score. Patients with higher scores have a greater risk for a heart attack, heart disease or stroke.  Knowing your score can help your doctor decide on blood pressure and cholesterol goals that will minimize your risk as much as possible.  The [employer] of Cardiology found that Coronary artery calcification (CAC) is an excellent cardiovascular disease risk marker and can help guide the decision to use cholesterol reducing medications such as statins. A negative calcium score may reduce the need for statins in otherwise eligible patients.  The exam takes less than 10 minutes, is painless and does not require any IV or oral contrast. At [facility] locations, the out-of-pocket fee without insurance is $75. At the time of scheduling, please let us know if you want to process the exam through your insurance or self-pay at the rate of $75. Patients who want to self-pay should not submit their insurance card when checking in for the appointment.   Who should get a Cardiac CT Calcium Score: Middle age adults at intermediate risk of heart disease Family history of heart disease Borderline high cholesterol, high blood pressure or diabetes Overweight or physical inactivity Uncertain about taking daily preventive medical therapy  Trial of viagra. Do NOT TAKE WITH NITROGLYCERIN.  Sildenafil Tablets (Erectile Dysfunction) What is this medication? SILDENAFIL (sil DEN a fil) treats erectile dysfunction (ED). It works by increasing blood flow to the penis, which helps to maintain an erection. This medicine may be used for other purposes; ask your health care provider or pharmacist if you have questions. COMMON BRAND NAME(S): Viagra What should I tell my care team before I take this medication?  They need to know if you have any of these conditions: Abnormal penis shape or Peyronie disease Bleeding disorders Eye or vision loss problems Heart disease Low blood pressure History of blood diseases, such as sickle cell anemia or leukemia History of painful and prolonged  erection Pulmonary veno-occlusive disease (PVOD) Stomach ulcers, other stomach or intestine problems An unusual or allergic reaction to sildenafil, other medications, foods, dyes, or preservatives Pregnant or trying to get pregnant Breastfeeding How should I use this medication? Take this medication by mouth with a glass of water. Follow the directions on the prescription label. The dose is usually taken 1 hour before sexual activity. You should not take the dose more than once per day. Do not take your medication more often than directed. Talk to your care team about the use of this medication in children. This medication is not used in children for this condition. Overdosage: If you think you have taken too much of this medicine contact a poison control center or emergency room at once. NOTE: This medicine is only for you. Do not share this medicine with others. What if I miss a dose? This does not apply. Do not take double or extra doses. What may interact with this medication? Do not take this medication with any of the following: Nitrates, such as amyl nitrite, isosorbide dinitrate, isosorbide mononitrate, nitroglycerin Riociguat Vericiguat This medication may also interact with the following: Bosentan Certain medications for blood pressure Other medications may affect the way this medication works. Talk with your care team about all of the medications you take. They may suggest changes to your treatment plan to lower the risk of side effects and to make sure your medications work as intended. This list may not describe all possible interactions. Give your health care provider a list of all the medicines, herbs, non-prescription drugs, or dietary supplements you use. Also tell them if you smoke, drink alcohol, or use illegal drugs. Some items may interact with your medicine. What should I watch for while using this medication? Visit your care team for regular checks on your progress.  Tell your care team if your symptoms do not start to get better or if they get worse. Tell your care team right away if you have any change in your eyesight or hearing. This medication may affect your coordination, reaction time, or judgment. Do not drive or operate machinery until you know how this medication affects you. Sit up or stand slowly to reduce the risk of dizzy or fainting spells. Drinking alcohol with this medication can increase the risk of these side effects. Contact your care team right away if you have an erection that lasts longer than 4 hours or if it becomes painful. This may be a sign of a serious problem and must be treated right away to prevent permanent damage. If you experience symptoms of nausea, dizziness, chest pain or arm pain upon initiation of sexual activity after taking this medication, you should refrain from further activity and call your care team as soon as possible. Using this medication does not protect you or your partner against HIV or other sexually transmitted infections (STIs). What side effects may I notice from receiving this medication? Side effects that you should report to your care team as soon as possible: Allergic reactions--skin rash, itching, hives, swelling of the face, lips, tongue, or throat Hearing loss or ringing in ears Heart attack--pain or tightness in the chest, shoulders, arms, or jaw, nausea, shortness of breath, cold or  clammy skin, feeling faint or lightheaded Heart rhythm changes--fast or irregular heartbeat, dizziness, feeling faint or lightheaded, chest pain, trouble breathing Low blood pressure--dizziness, feeling faint or lightheaded, blurry vision New or worsening shortness of breath Prolonged or painful erection Stroke--sudden numbness or weakness of the face, arm, or leg, trouble speaking, confusion, trouble walking, loss of balance or coordination, dizziness, severe headache, change in vision Sudden vision loss in one or both  eyes Side effects that usually do not require medical attention (report to your care team if they continue or are bothersome): Facial flushing or redness Headache Nosebleed Runny or stuffy nose Trouble sleeping Upset stomach This list may not describe all possible side effects. Call your doctor for medical advice about side effects. You may report side effects to FDA at 1-800-FDA-1088. Where should I keep my medication? Keep out of reach of children and pets. Store at room temperature between 15 and 30 degrees C (59 and 86 degrees F). Throw away any unused medication after the expiration date. NOTE: This sheet is a summary. It may not cover all possible information. If you have questions about this medicine, talk to your doctor, pharmacist, or health care provider.  2024 Elsevier/Gold Standard (2022-05-06 00:00:00)

## 2022-11-24 NOTE — Progress Notes (Signed)
Assessment & Plan:  Encounter for medical examination to establish care Assessment & Plan: Reviewed past medical and social history; reviewed family history.   Orders: -     CBC with Differential/Platelet; Future -     Comprehensive metabolic panel; Future -     VITAMIN D 25 Hydroxy (Vit-D Deficiency, Fractures); Future  Intermediate coronary syndrome [facility]) Assessment & Plan: Reviewed previous cardiac catheterization in 2016.  Patient is no longer on rosuvastatin as medication caused memory loss;  metoprolol caused dizziness.  Fortunately he is asymptomatic at this time.  Discussed creating time for exercise and also obtaining CT calcium score to further stratify his overall cardiovascular risk, particular in the setting of his family history.  Patient will schedule this test.  Pending lipid panel  Orders: -     CT CARDIAC SCORING (SELF PAY ONLY); Future -     Hemoglobin A1c; Future -     Lipid panel; Future -     TSH; Future  Erectile dysfunction, unspecified erectile dysfunction type Assessment & Plan: Pending urinalysis.  He is no longer taking nitroglycerin, and I  have counseled in regards to the risk of hypotension if  he were to take nitro and Viagra at the same time.  Trial of Viagra 25 mg as needed   Orders: -     Sildenafil Citrate; Take 1 tablet (25 mg total) by mouth daily as needed for erectile dysfunction.  Dispense: 10 tablet; Refill: 1 -     Urinalysis, Routine w reflex microscopic; Future  Snoring Assessment & Plan: Pending evaluation to pulmonology for OSA.   Orders: -     Ambulatory referral to Pulmonology  Attention deficit hyperactivity disorder (ADHD), unspecified ADHD type Assessment & Plan: Dx in 1984; awaiting to be formally tested and referral placed.   Orders: -     Ambulatory referral to Psychiatry  Elevated blood pressure reading Assessment & Plan: Elevated today.  I have sent patient MyChart message in regards to keeping blood pressure  log at home and sending me these values.  Advised low-sodium diet.       Return precautions given.   Risks, benefits, and alternatives of the medications and treatment plan prescribed today were discussed, and patient expressed understanding.   Education regarding symptom management and diagnosis given to patient on AVS either electronically or printed.  Return in about 1 year (around 11/24/2023) for Complete Physical Exam, Fasting labs in 2-3 weeks.  Rennie Plowman, FNP  Subjective:    Patient ID: Kenneth Lopez, male    DOB: [birth year], 47 y.o.   MRN: [id number]  CC: SHAFER SWAMY is a 47 y.o. male who presents today to establish care.    HPI:   He is concerned for sleep apnea as his wife has noticed apneic episodes.   Denies HA.   He enjoys hiking and camping.   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  Work can be physical.   Diagnosed with ADHD 1984. He will look to find this paperwork.   Rosuvastatin cause memory loss. Metoprolol caused dizziness.      He reports SOB when hiking in 2016 which led to ED presentation for cardiac evaluation.   S/p cardiac catheterization 08/28/2014, Very tiny distal PL branch of RCA occlusion and recanalized and faint collaterals from secondary branch. Approximately 60-70% stenosis. EKG changes correlate with this. Normal coronary arteries otherwise and normal LVEF, 60%. Normal LVEDP, No PG across the AV.   He complains  of erectile dysfunction.  Denies any concern for sexually transmitted disease, hematuria, urinary hesitancy.  He is not taking nitroglycerin which was prescribed after his cardiac catheterization  Allergies: Crestor [rosuvastatin] No current outpatient medications on file prior to visit.   No current facility-administered medications on file prior to visit.    Review of Systems  Constitutional:  Negative for chills and fever.  Respiratory:  Negative for cough.   Cardiovascular:  Negative  for chest pain and palpitations.  Gastrointestinal:  Negative for nausea and vomiting.      Objective:    BP 138/86   Pulse 81   Temp 98.4 F (36.9 C) (Oral)   Ht 6\' 2"  (1.88 m)   Wt (!) 307 lb 3.2 oz (139.3 kg)   SpO2 98%   BMI 39.44 kg/m  BP Readings from Last 3 Encounters:  11/24/22 138/86  08/28/14 108/69  01/24/12 (!) 144/93   Wt Readings from Last 3 Encounters:  11/24/22 (!) 307 lb 3.2 oz (139.3 kg)  08/28/14 292 lb (132.5 kg)  01/24/12 271 lb 6.4 oz (123.1 kg)    Physical Exam Vitals reviewed.  Constitutional:      Appearance: He is well-developed.  Cardiovascular:     Rate and Rhythm: Regular rhythm.     Heart sounds: Normal heart sounds.  Pulmonary:     Effort: Pulmonary effort is normal. No respiratory distress.     Breath sounds: Normal breath sounds. No wheezing, rhonchi or rales.  Skin:    General: Skin is warm and dry.  Neurological:     Mental Status: He is alert.  Psychiatric:        Speech: Speech normal.        Behavior: Behavior normal.

## 2022-11-24 NOTE — Assessment & Plan Note (Addendum)
Dx in 1984; awaiting to be formally tested and referral placed.

## 2022-11-25 ENCOUNTER — Encounter: Payer: Self-pay | Admitting: Family

## 2022-11-25 ENCOUNTER — Telehealth: Payer: Self-pay | Admitting: Family

## 2022-11-25 DIAGNOSIS — R030 Elevated blood-pressure reading, without diagnosis of hypertension: Secondary | ICD-10-CM | POA: Insufficient documentation

## 2022-11-25 NOTE — Assessment & Plan Note (Signed)
Reviewed past medical and social history; reviewed family history.

## 2022-11-25 NOTE — Assessment & Plan Note (Signed)
Pending evaluation to pulmonology for OSA.

## 2022-11-25 NOTE — Assessment & Plan Note (Signed)
Reviewed previous cardiac catheterization in 2016.  Patient is no longer on rosuvastatin as medication caused memory loss;  metoprolol caused dizziness.  Fortunately he is asymptomatic at this time.  Discussed creating time for exercise and also obtaining CT calcium score to further stratify his overall cardiovascular risk, particular in the setting of his family history.  Patient will schedule this test.  Pending lipid panel

## 2022-11-25 NOTE — Assessment & Plan Note (Signed)
Elevated today.  I have sent patient MyChart message in regards to keeping blood pressure log at home and sending me these values.  Advised low-sodium diet.

## 2022-11-25 NOTE — Telephone Encounter (Signed)
Lft pt vm to call ofc . thanks 

## 2022-11-25 NOTE — Assessment & Plan Note (Signed)
Pending urinalysis.  He is no longer taking nitroglycerin, and I  have counseled in regards to the risk of hypotension if  he were to take nitro and Viagra at the same time.  Trial of Viagra 25 mg as needed

## 2022-11-28 ENCOUNTER — Telehealth: Payer: Self-pay | Admitting: Family

## 2022-11-28 NOTE — Telephone Encounter (Signed)
Lft pt vm to call ofc to sch CT. thanks 

## 2022-11-29 ENCOUNTER — Telehealth: Payer: Self-pay | Admitting: Family

## 2022-11-29 NOTE — Telephone Encounter (Signed)
Lft pt vm to call ofc to sch CT. thanks 

## 2022-12-06 ENCOUNTER — Encounter: Payer: Self-pay | Admitting: Internal Medicine

## 2022-12-16 ENCOUNTER — Other Ambulatory Visit (INDEPENDENT_AMBULATORY_CARE_PROVIDER_SITE_OTHER): Payer: Managed Care, Other (non HMO)

## 2022-12-16 DIAGNOSIS — N529 Male erectile dysfunction, unspecified: Secondary | ICD-10-CM | POA: Diagnosis not present

## 2022-12-16 DIAGNOSIS — I200 Unstable angina: Secondary | ICD-10-CM | POA: Diagnosis not present

## 2022-12-16 DIAGNOSIS — Z0000 Encounter for general adult medical examination without abnormal findings: Secondary | ICD-10-CM | POA: Diagnosis not present

## 2022-12-16 LAB — COMPREHENSIVE METABOLIC PANEL
ALT: 25 U/L (ref 0–53)
AST: 20 U/L (ref 0–37)
Albumin: 4.5 g/dL (ref 3.5–5.2)
Alkaline Phosphatase: 76 U/L (ref 39–117)
BUN: 15 mg/dL (ref 6–23)
CO2: 24 meq/L (ref 19–32)
Calcium: 9.5 mg/dL (ref 8.4–10.5)
Chloride: 103 meq/L (ref 96–112)
Creatinine, Ser: 0.85 mg/dL (ref 0.40–1.50)
GFR: 104.66 mL/min (ref >60.00)
Glucose, Bld: 92 mg/dL (ref 70–99)
Potassium: 4.3 meq/L (ref 3.5–5.1)
Sodium: 141 meq/L (ref 135–145)
Total Bilirubin: 0.5 mg/dL (ref 0.2–1.2)
Total Protein: 6.7 g/dL (ref 6.0–8.3)

## 2022-12-16 LAB — URINALYSIS, ROUTINE W REFLEX MICROSCOPIC
Bilirubin Urine: NEGATIVE
Hgb urine dipstick: NEGATIVE
Ketones, ur: NEGATIVE
Leukocytes,Ua: NEGATIVE
Nitrite: NEGATIVE
Specific Gravity, Urine: 1.02 (ref 1.000–1.030)
Total Protein, Urine: NEGATIVE
Urine Glucose: NEGATIVE
Urobilinogen, UA: 0.2 (ref 0.0–1.0)
pH: 6 (ref 5.0–8.0)

## 2022-12-16 LAB — LIPID PANEL
Cholesterol: 233 mg/dL — ABNORMAL HIGH (ref 0–200)
HDL: 43 mg/dL (ref >39.00)
LDL Cholesterol: 146 mg/dL — ABNORMAL HIGH (ref 0–99)
NonHDL: 190.1
Total CHOL/HDL Ratio: 5
Triglycerides: 223 mg/dL — ABNORMAL HIGH (ref 0.0–149.0)
VLDL: 44.6 mg/dL — ABNORMAL HIGH (ref 0.0–40.0)

## 2022-12-16 LAB — CBC WITH DIFFERENTIAL/PLATELET
Basophils Absolute: 0.1 10*3/uL (ref 0.0–0.1)
Basophils Relative: 0.7 % (ref 0.0–3.0)
Eosinophils Absolute: 0.2 10*3/uL (ref 0.0–0.7)
Eosinophils Relative: 2.1 % (ref 0.0–5.0)
HCT: 48.7 % (ref 39.0–52.0)
Hemoglobin: 15.8 g/dL (ref 13.0–17.0)
Lymphocytes Relative: 34.4 % (ref 12.0–46.0)
Lymphs Abs: 2.6 10*3/uL (ref 0.7–4.0)
MCHC: 32.6 g/dL (ref 30.0–36.0)
MCV: 90.4 fL (ref 78.0–100.0)
Monocytes Absolute: 0.6 10*3/uL (ref 0.1–1.0)
Monocytes Relative: 7.5 % (ref 3.0–12.0)
Neutro Abs: 4.2 10*3/uL (ref 1.4–7.7)
Neutrophils Relative %: 55.3 % (ref 43.0–77.0)
Platelets: 263 10*3/uL (ref 150.0–400.0)
RBC: 5.38 Mil/uL (ref 4.22–5.81)
RDW: 13 % (ref 11.5–15.5)
WBC: 7.6 10*3/uL (ref 4.0–10.5)

## 2022-12-16 LAB — TSH: TSH: 1.56 u[IU]/mL (ref 0.35–5.50)

## 2022-12-16 LAB — VITAMIN D 25 HYDROXY (VIT D DEFICIENCY, FRACTURES): VITD: 14.92 ng/mL — ABNORMAL LOW (ref 30.00–100.00)

## 2022-12-16 LAB — HEMOGLOBIN A1C: Hgb A1c MFr Bld: 5.5 % (ref 4.6–6.5)

## 2022-12-22 ENCOUNTER — Telehealth: Payer: Self-pay | Admitting: Family

## 2022-12-22 NOTE — Telephone Encounter (Signed)
Lft pt vm to call ofc to sch ct.thanks

## 2023-06-14 ENCOUNTER — Ambulatory Visit: Payer: Self-pay

## 2023-06-14 NOTE — Telephone Encounter (Signed)
  Chief Complaint: sinus congestion Symptoms: fever, cough  Disposition: [] ED /[x] Urgent Care (no appt availability in office) / [] Appointment(In office/virtual)/ []  [facility]/ [] Home Care/ [] Refused Recommended Disposition /[] [facility]/ []  Follow-up with PCP Additional Notes: Wife Kenneth Lopez called on behalf of pt. Pt has ben sick since end of last week.  Pt has sinus congestion/pressure, productive cough, fever 100-101, dark yellow/bloody mucus from blowing nose, and earache. Pt rated pain 7-8/10. No office appts today. Pt decided he was going to go to urgent care today. "He has missed three days of work. He can't miss anymore." Pt has been using neti pot and Afrin to help with congestion. RN suggested pt call back if symptoms worsen after seeking care at [facility]. Kenneth Lopez verbalized understanding.            Reason for Disposition  [1] SEVERE pain AND [2] not improved 2 hours after pain medicine  Answer Assessment - Initial Assessment Questions 1. LOCATION: "Where does it hurt?"      Nose, under eyes, behind ears 2. ONSET: "When did the sinus pain start?"  (e.g., hours, days)      Over weekend  3. SEVERITY: "How bad is the pain?"   (Scale 1-10; mild, moderate or severe)   - MILD (1-3): doesn't interfere with normal activities    - MODERATE (4-7): interferes with normal activities (e.g., work or school) or awakens from sleep   - SEVERE (8-10): excruciating pain and patient unable to do any normal activities        7-8  5. NASAL CONGESTION: "Is the nose blocked?" If Yes, ask: "Can you open it or must you breathe through your mouth?"     yeah 6. NASAL DISCHARGE: "Do you have discharge from your nose?" If so ask, "What color?"     Dark yellow, some blood 7. FEVER: "Do you have a fever?" If Yes, ask: "What is it, how was it measured, and when did it start?"      100-101 8. OTHER SYMPTOMS: "Do you have any other symptoms?" (e.g., sore throat, cough, earache, difficulty  breathing)     Cough, fever, heavy breathing, earache  Protocols used: Sinus Pain or Congestion-A-AH

## 2023-06-14 NOTE — Telephone Encounter (Signed)
 LVM for pt wife Kenneth Lopez to give me a call back to check on pt to see if he went to [facility]

## 2023-06-15 ENCOUNTER — Telehealth: Payer: Self-pay

## 2023-06-15 NOTE — Telephone Encounter (Signed)
 Spoke to [place] she stated that pt is feeling better today no fever, went to [facility] will call if he needs to be seen in office

## 2023-06-15 NOTE — Telephone Encounter (Signed)
 Copied from CRM [id number]. Topic: General - Call Back - No Documentation >> Jun 14, 2023  3:33 PM Freya Jesus wrote: Reason for CRM: Patient spouse returning call from [place], Called [facility] and was advised with a patient and to send a CRM to return call.

## 2023-06-15 NOTE — Telephone Encounter (Signed)
 Spoke to pt wife see previous notes

## 2023-11-24 ENCOUNTER — Encounter: Payer: Managed Care, Other (non HMO) | Admitting: Family
